# Patient Record
Sex: Male | Born: 1995 | Race: White | Hispanic: No | Marital: Single | Smoking: Never smoker
Health system: Southern US, Community
[De-identification: ages and names within clinical notes are randomized; demographics above are authoritative.]

---
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
## 2019-11-17 ENCOUNTER — Ambulatory Visit: Payer: BC Managed Care – PPO | Admitting: Psychiatry

## 2020-01-22 ENCOUNTER — Ambulatory Visit: Payer: BC Managed Care – PPO | Admitting: Psychiatry
